# Patient Record
Sex: Female | Born: 1990 | Race: White | Hispanic: No | Marital: Married | Smoking: Never smoker
Health system: Southern US, Community
[De-identification: ages and names within clinical notes are randomized; demographics above are authoritative.]

---

## 2018-04-20 DIAGNOSIS — R110 Nausea: Secondary | ICD-10-CM | POA: Insufficient documentation

## 2018-04-20 DIAGNOSIS — R05 Cough: Secondary | ICD-10-CM | POA: Diagnosis not present

## 2018-04-20 DIAGNOSIS — R42 Dizziness and giddiness: Secondary | ICD-10-CM | POA: Diagnosis not present

## 2018-04-20 DIAGNOSIS — J019 Acute sinusitis, unspecified: Secondary | ICD-10-CM | POA: Insufficient documentation

## 2018-04-20 DIAGNOSIS — R197 Diarrhea, unspecified: Secondary | ICD-10-CM | POA: Insufficient documentation

## 2018-04-20 DIAGNOSIS — F1210 Cannabis abuse, uncomplicated: Secondary | ICD-10-CM | POA: Insufficient documentation

## 2018-04-21 ENCOUNTER — Other Ambulatory Visit: Payer: Self-pay

## 2018-04-21 ENCOUNTER — Encounter: Payer: Self-pay | Admitting: Emergency Medicine

## 2018-04-21 ENCOUNTER — Emergency Department
Admission: EM | Admit: 2018-04-21 | Discharge: 2018-04-21 | Disposition: A | Discharge status: Home / Self Care | Payer: PRIVATE HEALTH INSURANCE | Attending: Emergency Medicine | Admitting: Emergency Medicine

## 2018-04-21 ENCOUNTER — Emergency Department: Payer: PRIVATE HEALTH INSURANCE

## 2018-04-21 DIAGNOSIS — J019 Acute sinusitis, unspecified: Secondary | ICD-10-CM

## 2018-04-21 DIAGNOSIS — R42 Dizziness and giddiness: Secondary | ICD-10-CM

## 2018-04-21 LAB — URINALYSIS, COMPLETE (UACMP) WITH MICROSCOPIC
BILIRUBIN URINE: NEGATIVE
Bacteria, UA: NONE SEEN
GLUCOSE, UA: NEGATIVE mg/dL
HGB URINE DIPSTICK: NEGATIVE
KETONES UR: NEGATIVE mg/dL
LEUKOCYTES UA: NEGATIVE
NITRITE: NEGATIVE
Protein, ur: NEGATIVE mg/dL
Specific Gravity, Urine: 1.027 (ref 1.005–1.030)
pH: 6 (ref 5.0–8.0)

## 2018-04-21 LAB — BASIC METABOLIC PANEL
ANION GAP: 6 (ref 5–15)
BUN: 15 mg/dL (ref 6–20)
CO2: 24 mmol/L (ref 22–32)
Calcium: 9 mg/dL (ref 8.9–10.3)
Chloride: 108 mmol/L (ref 98–111)
Creatinine, Ser: 0.77 mg/dL (ref 0.44–1.00)
GFR calc Af Amer: >60 mL/min (ref >60)
GFR calc non Af Amer: >60 mL/min (ref >60)
GLUCOSE: 112 mg/dL — AB (ref 70–99)
Potassium: 3.4 mmol/L — ABNORMAL LOW (ref 3.5–5.1)
Sodium: 138 mmol/L (ref 135–145)

## 2018-04-21 LAB — HEPATIC FUNCTION PANEL
ALT: 15 U/L (ref 0–44)
AST: 25 U/L (ref 15–41)
Albumin: 4 g/dL (ref 3.5–5.0)
Alkaline Phosphatase: 56 U/L (ref 38–126)
BILIRUBIN INDIRECT: 0.6 mg/dL (ref 0.3–0.9)
BILIRUBIN TOTAL: 0.8 mg/dL (ref 0.3–1.2)
Bilirubin, Direct: 0.2 mg/dL (ref 0.0–0.2)
Total Protein: 7 g/dL (ref 6.5–8.1)

## 2018-04-21 LAB — CBC
HCT: 35.1 % (ref 35.0–47.0)
HEMOGLOBIN: 12 g/dL (ref 12.0–16.0)
MCH: 29.5 pg (ref 26.0–34.0)
MCHC: 34.1 g/dL (ref 32.0–36.0)
MCV: 86.6 fL (ref 80.0–100.0)
Platelets: 317 10*3/uL (ref 150–440)
RBC: 4.05 MIL/uL (ref 3.80–5.20)
RDW: 14.3 % (ref 11.5–14.5)
WBC: 7.9 10*3/uL (ref 3.6–11.0)

## 2018-04-21 LAB — POCT PREGNANCY, URINE: Preg Test, Ur: NEGATIVE

## 2018-04-21 LAB — ETHANOL

## 2018-04-21 LAB — LIPASE, BLOOD: Lipase: 26 U/L (ref 11–51)

## 2018-04-21 MED ORDER — MECLIZINE HCL 25 MG PO TABS: 25.0000 mg | ORAL_TABLET | Freq: Three times a day (TID) | ORAL | 0 refills | Status: AC | PRN

## 2018-04-21 MED ORDER — ONDANSETRON 4 MG PO TBDP
ORAL_TABLET | ORAL | Status: AC
  Administered 2018-04-21: 4 mg via ORAL
  Filled 2018-04-21: qty 1

## 2018-04-21 MED ORDER — ONDANSETRON 4 MG PO TBDP: 4.0000 mg | ORAL_TABLET | Freq: Three times a day (TID) | ORAL | 0 refills | Status: AC | PRN

## 2018-04-21 MED ORDER — ONDANSETRON HCL 4 MG/2ML IJ SOLN
4.0000 mg | Freq: Once | INTRAMUSCULAR | Status: AC
  Administered 2018-04-21: 4 mg via INTRAVENOUS
  Filled 2018-04-21: qty 2

## 2018-04-21 MED ORDER — AZITHROMYCIN 250 MG PO TABS: ORAL_TABLET | ORAL | 0 refills | Status: AC

## 2018-04-21 MED ORDER — SODIUM CHLORIDE 0.9 % IV BOLUS
1000.0000 mL | Freq: Once | INTRAVENOUS | Status: AC
  Administered 2018-04-21: 1000 mL via INTRAVENOUS

## 2018-04-21 MED ORDER — DIAZEPAM 2 MG PO TABS
2.0000 mg | ORAL_TABLET | Freq: Once | ORAL | Status: AC
  Administered 2018-04-21: 2 mg via ORAL
  Filled 2018-04-21: qty 1

## 2018-04-21 MED ORDER — ONDANSETRON 4 MG PO TBDP
4.0000 mg | ORAL_TABLET | Freq: Once | ORAL | Status: AC
  Administered 2018-04-21: 4 mg via ORAL

## 2018-04-21 NOTE — ED Notes (Signed)
Pt reports difficulty focusing and feeling "like I'm still the boat", taking OTC sudafed and Dayquil for "flu like" s/sx

## 2018-04-21 NOTE — ED Notes (Signed)
Patient transported to CT 

## 2018-04-21 NOTE — ED Notes (Signed)
Peripheral IV discontinued. Catheter intact. No signs of infiltration or redness. Gauze applied to IV site.   Discharge instructions reviewed with patient. Questions fielded by this RN. Patient verbalizes understanding of instructions. Patient discharged home in stable condition per sung. No acute distress noted at time of discharge.    

## 2018-04-21 NOTE — ED Notes (Signed)
ED Provider at bedside. 

## 2018-04-21 NOTE — ED Notes (Signed)
Pt reports difficulty focusing on objects worse when standing and pt unsteady, pt reports last sudafed at 2345 yesterday  Pupils appear large

## 2018-04-21 NOTE — Discharge Instructions (Addendum)
1.  You can take medicines as needed for dizziness and nausea (Meclizine/Zofran #20). 2.  Take Z-Pak as prescribed for sinus infection. 3.  Return to the ER for worsening symptoms, persistent vomiting, difficulty breathing or other concerns.

## 2018-04-21 NOTE — ED Provider Notes (Signed)
[facility] Emergency Department Provider Note   ____________________________________________   First MD Initiated Contact with Patient 04/21/18 0210     (approximate)  I have reviewed the triage vital signs and the nursing notes.   HISTORY  Chief Complaint Dizziness    HPI Tracie Woods is a 27 y.o. female who presents to the ED from home with a chief complaint of dizziness.  Patient drove back 6 days ago from a cruise.  That day she had some diarrhea.  Also started feeling like she was having a cold with sinus congestion and dry cough.  Tonight felt dizzy like the room was spinning around her.  Symptoms exacerbated by turning her head.  Symptoms associated with nausea without vomiting.  Denies associated fever, chills, chest pain, shortness of breath, abdominal pain, dysuria.   Past medical history None  There are no active problems to display for this patient.   History reviewed. No pertinent surgical history.  Prior to Admission medications   Medication Sig Start Date End Date Taking? Authorizing Provider  azithromycin (ZITHROMAX Z-PAK) 250 MG tablet Take 2 tablets day #1, then 1 tablet daily until finished 04/21/18   Irean Hong, MD  meclizine (ANTIVERT) 25 MG tablet Take 1 tablet (25 mg total) by mouth 3 (three) times daily as needed for dizziness or nausea. 04/21/18   Irean Hong, MD  ondansetron (ZOFRAN ODT) 4 MG disintegrating tablet Take 1 tablet (4 mg total) by mouth every 8 (eight) hours as needed for nausea or vomiting. 04/21/18   Irean Hong, MD    Allergies Patient has no known allergies.  No family history on file.  Social History Social History   Tobacco Use  . Smoking status: Never Smoker  . Smokeless tobacco: Never Used  Substance Use Topics  . Alcohol use: Yes  . Drug use: Yes    Types: Marijuana    Review of Systems  Constitutional: No fever/chills Eyes: No visual changes. ENT: No sore throat.  Positive for  nasal congestion. Cardiovascular: Denies chest pain. Respiratory: Denies shortness of breath. Gastrointestinal: No abdominal pain.  Positive for nausea, no vomiting.  No diarrhea.  No constipation. Genitourinary: Negative for dysuria. Musculoskeletal: Negative for back pain. Skin: Negative for rash. Neurological: Positive for dizziness.  Negative for headaches, focal weakness or numbness.   ____________________________________________   PHYSICAL EXAM:  VITAL SIGNS: ED Triage Vitals  Enc Vitals Group     BP 04/21/18 0006 (!) 126/54     Pulse Rate 04/21/18 0006 65     Resp 04/21/18 0006 18     Temp 04/21/18 0006 97.8 F (36.6 C)     Temp Source 04/21/18 0006 Oral     SpO2 04/21/18 0006 100 %     Weight 04/21/18 0006 230 lb (104.3 kg)     Height 04/21/18 0006 5\' 3"  (1.6 m)     Head Circumference --      Peak Flow --      Pain Score 04/21/18 0013 0     Pain Loc --      Pain Edu? --      Excl. in GC? --     Constitutional: Alert and oriented. Well appearing and in mild acute distress. Eyes: Conjunctivae are normal. PERRL. EOMI. Head: Atraumatic.  No sinus tenderness to palpation. Ears: Left TM with fluid. Nose: Congestion/rhinnorhea. Mouth/Throat: Mucous membranes are moist.  Oropharynx non-erythematous. Neck: No stridor.  Supple neck without meningismus.  No carotid bruits. Hematological/Lymphatic/Immunilogical:  No cervical lymphadenopathy. Cardiovascular: Normal rate, regular rhythm. Grossly normal heart sounds.  Good peripheral circulation. Respiratory: Normal respiratory effort.  No retractions. Lungs CTAB. Gastrointestinal: Soft and nontender. No distention. No abdominal bruits. No CVA tenderness. Musculoskeletal: No lower extremity tenderness nor edema.  No joint effusions. Neurologic:  Normal speech and language. No gross focal neurologic deficits are appreciated.  Skin:  Skin is warm, dry and intact. No rash noted. Psychiatric: Mood and affect are normal. Speech  and behavior are normal.  ____________________________________________   LABS (all labs ordered are listed, but only abnormal results are displayed)  Labs Reviewed  BASIC METABOLIC PANEL - Abnormal; Notable for the following components:      Result Value   Potassium 3.4 (*)    Glucose, Bld 112 (*)    All other components within normal limits  URINALYSIS, COMPLETE (UACMP) WITH MICROSCOPIC - Abnormal; Notable for the following components:   Color, Urine YELLOW (*)    APPearance CLEAR (*)    All other components within normal limits  CBC  HEPATIC FUNCTION PANEL  LIPASE, BLOOD  ETHANOL  POCT PREGNANCY, URINE  CBG MONITORING, ED  POC URINE PREG, ED   ____________________________________________  EKG  ED ECG REPORT I, SUNG,JADE J, the attending physician, personally viewed and interpreted this ECG.   Date: 04/21/2018  EKG Time: 0027  Rate: 62  Rhythm: normal EKG, normal sinus rhythm  Axis: Normal  Intervals:none  ST&T Change: Nonspecific  ____________________________________________  RADIOLOGY  ED MD interpretation: No acute ICH  Official radiology report(s): Ct Head Wo Contrast  Result Date: 04/21/2018 CLINICAL DATA:  27 year old female with dizziness. EXAM: CT HEAD WITHOUT CONTRAST TECHNIQUE: Contiguous axial images were obtained from the base of the skull through the vertex without intravenous contrast. COMPARISON:  None. FINDINGS: Brain: No evidence of acute infarction, hemorrhage, hydrocephalus, extra-axial collection or mass lesion/mass effect. Vascular: No hyperdense vessel or unexpected calcification. Skull: Normal. Negative for fracture or focal lesion. Sinuses/Orbits: No acute finding. Other: None. IMPRESSION: Normal noncontrast CT of the brain. Electronically Signed   By: Elgie Collard M.D.   On: 04/21/2018 03:18    ____________________________________________   PROCEDURES  Procedure(s) performed: None  Procedures  Critical Care performed:  No  ____________________________________________   INITIAL IMPRESSION / ASSESSMENT AND PLAN / ED COURSE  As part of my medical decision making, I reviewed the following data within the electronic MEDICAL RECORD NUMBER History obtained from family, Nursing notes reviewed and incorporated, Labs reviewed, EKG interpreted, Old chart reviewed, Radiograph reviewed and Notes from prior ED visits   27 year old female who presents with a one-week history of URI type symptoms, now with dizziness.  Differential diagnosis includes but is not limited to eustachian tube dysfunction, vertigo, CAD, TIA, electrolyte abnormalities, etc.  Laboratory results grossly unremarkable.  Clinically patient's symptoms suggest vertigo.  Will obtain CT head to evaluate for intracranial abnormalities.  Will initiate IV fluid resuscitation, 4 mg IV Zofran for nausea, 2 mg oral Valium for dizziness.  Clinical Course as of Apr 21 658  Sat Apr 21, 2018  0543 Patient feeling significantly better.  Will discharge home with prescriptions for meclizine and Zofran to use as needed.  Strict return precautions given.  Patient and wife verbalize understanding and agree with plan of care.   [JS]  [id number] Patient requesting antibiotic to clear up her sinus infection.  Will prescribe azithromycin.   [JS]    Clinical Course User Index [JS] Irean Hong, MD     ____________________________________________  FINAL CLINICAL IMPRESSION(S) / ED DIAGNOSES  Final diagnoses:  Dizziness  Vertigo  Acute non-recurrent sinusitis, unspecified location     ED Discharge Orders         Ordered    meclizine (ANTIVERT) 25 MG tablet  3 times daily PRN     04/21/18 0544    ondansetron (ZOFRAN ODT) 4 MG disintegrating tablet  Every 8 hours PRN     04/21/18 0544    azithromycin (ZITHROMAX Z-PAK) 250 MG tablet     04/21/18 1610           Note:  This document was prepared using Dragon voice recognition software and may include unintentional  dictation errors.    Irean Hong, MD 04/21/18 [phone]

## 2018-04-21 NOTE — ED Triage Notes (Signed)
Pt arrives POV to triage with c/o dizziness and "feeling drunk". Pt reports that she got back Sunday from a cruise and had an URI this weak. Pt is in NAD.

## 2018-04-23 LAB — GLUCOSE, CAPILLARY: Glucose-Capillary: 103 mg/dL — ABNORMAL HIGH (ref 70–99)

## 2019-10-13 ENCOUNTER — Other Ambulatory Visit: Payer: Self-pay

## 2019-10-13 ENCOUNTER — Emergency Department: Payer: No Typology Code available for payment source

## 2019-10-13 DIAGNOSIS — R1011 Right upper quadrant pain: Secondary | ICD-10-CM | POA: Insufficient documentation

## 2019-10-13 DIAGNOSIS — R1013 Epigastric pain: Secondary | ICD-10-CM | POA: Insufficient documentation

## 2019-10-13 LAB — COMPREHENSIVE METABOLIC PANEL
ALT: 16 U/L (ref 0–44)
AST: 73 U/L — ABNORMAL HIGH (ref 15–41)
Albumin: 4.3 g/dL (ref 3.5–5.0)
Alkaline Phosphatase: 51 U/L (ref 38–126)
Anion gap: 9 (ref 5–15)
BUN: 13 mg/dL (ref 6–20)
CO2: 21 mmol/L — ABNORMAL LOW (ref 22–32)
Calcium: 9.6 mg/dL (ref 8.9–10.3)
Chloride: 107 mmol/L (ref 98–111)
Creatinine, Ser: 0.7 mg/dL (ref 0.44–1.00)
GFR calc Af Amer: >60 mL/min (ref >60)
GFR calc non Af Amer: >60 mL/min (ref >60)
Glucose, Bld: 102 mg/dL — ABNORMAL HIGH (ref 70–99)
Potassium: 3.4 mmol/L — ABNORMAL LOW (ref 3.5–5.1)
Sodium: 137 mmol/L (ref 135–145)
Total Bilirubin: 0.6 mg/dL (ref 0.3–1.2)
Total Protein: 7.4 g/dL (ref 6.5–8.1)

## 2019-10-13 LAB — TROPONIN I (HIGH SENSITIVITY): Troponin I (High Sensitivity): <2 ng/L (ref <18)

## 2019-10-13 LAB — CBC
HCT: 39 % (ref 36.0–46.0)
Hemoglobin: 13.1 g/dL (ref 12.0–15.0)
MCH: 29.7 pg (ref 26.0–34.0)
MCHC: 33.6 g/dL (ref 30.0–36.0)
MCV: 88.4 fL (ref 80.0–100.0)
Platelets: 300 10*3/uL (ref 150–400)
RBC: 4.41 MIL/uL (ref 3.87–5.11)
RDW: 13.7 % (ref 11.5–15.5)
WBC: 7.8 10*3/uL (ref 4.0–10.5)
nRBC: 0 % (ref 0.0–0.2)

## 2019-10-13 NOTE — ED Triage Notes (Signed)
Pt states a week of central chest pain that radiates to left shoulder. Pt states she feels shob, states nausea, denies dizziness. Pt states after eating "I feel terrible". Pt denies fever.

## 2019-10-14 ENCOUNTER — Emergency Department: Payer: No Typology Code available for payment source

## 2019-10-14 ENCOUNTER — Encounter: Payer: Self-pay | Admitting: Radiology

## 2019-10-14 ENCOUNTER — Emergency Department
Admission: EM | Admit: 2019-10-14 | Discharge: 2019-10-14 | Disposition: A | Discharge status: Home / Self Care | Payer: No Typology Code available for payment source | Attending: Emergency Medicine | Admitting: Emergency Medicine

## 2019-10-14 DIAGNOSIS — R1011 Right upper quadrant pain: Secondary | ICD-10-CM

## 2019-10-14 DIAGNOSIS — R1013 Epigastric pain: Secondary | ICD-10-CM

## 2019-10-14 LAB — FIBRIN DERIVATIVES D-DIMER (ARMC ONLY): Fibrin derivatives D-dimer (ARMC): 211.53 ng/mL (FEU) (ref 0.00–499.00)

## 2019-10-14 MED ORDER — LIDOCAINE VISCOUS HCL 2 % MT SOLN
15.0000 mL | Freq: Once | OROMUCOSAL | Status: AC
  Administered 2019-10-14: 15 mL via ORAL
  Filled 2019-10-14: qty 15

## 2019-10-14 MED ORDER — PANTOPRAZOLE SODIUM 40 MG PO TBEC: 40.0000 mg | DELAYED_RELEASE_TABLET | Freq: Two times a day (BID) | ORAL | 1 refills | Status: AC

## 2019-10-14 MED ORDER — ONDANSETRON 4 MG PO TBDP
4.0000 mg | ORAL_TABLET | Freq: Once | ORAL | Status: AC
  Administered 2019-10-14: 4 mg via ORAL

## 2019-10-14 MED ORDER — ALUM & MAG HYDROXIDE-SIMETH 200-200-20 MG/5ML PO SUSP
30.0000 mL | Freq: Once | ORAL | Status: AC
  Administered 2019-10-14: 30 mL via ORAL
  Filled 2019-10-14: qty 30

## 2019-10-14 MED ORDER — IOHEXOL 300 MG/ML  SOLN
100.0000 mL | Freq: Once | INTRAMUSCULAR | Status: AC | PRN
  Administered 2019-10-14: 100 mL via INTRAVENOUS

## 2019-10-14 MED ORDER — ONDANSETRON 4 MG PO TBDP
ORAL_TABLET | ORAL | Status: AC
  Filled 2019-10-14: qty 1

## 2019-10-14 NOTE — ED Notes (Signed)
Ultrasound tech at bedside for portable ultrasound

## 2019-10-14 NOTE — ED Notes (Signed)
Patient c/o chest pain radiating to back and epigastric region.

## 2019-10-14 NOTE — ED Notes (Signed)
Patient reports nausea has gotten better. Patient able to ambulate around room with no issue/no additional discomfort. MD informed. MD ordered dishcharge.

## 2019-10-14 NOTE — ED Notes (Signed)
Patient transported to CT 

## 2019-10-14 NOTE — ED Notes (Signed)
Reviewed discharge instructions, follow-up care, and prescriptions with patient. Patient verbalized understanding of all information reviewed. Patient stable, with no distress noted at this time.    

## 2019-10-14 NOTE — ED Provider Notes (Signed)
[facility] Emergency Department Provider Note  ____________________________________________   First MD Initiated Contact with Patient 10/14/19 0131     (approximate)  I have reviewed the triage vital signs and the nursing notes.   HISTORY  Chief Complaint Chest Pain    HPI Tracie Woods is a 29 y.o. female with below list of previous medical conditions presents to the emergency department secondary to 6 out of 10 epigastric abdominal pain with radiation to the left shoulder and associated nausea and dyspnea.  Patient states that pain is worse after eating.  Patient states this has been occurring since yesterday.        History reviewed. No pertinent past medical history.  There are no problems to display for this patient.   No past surgical history on file.  Prior to Admission medications   Medication Sig Start Date End Date Taking? Authorizing Provider  azithromycin (ZITHROMAX Z-PAK) 250 MG tablet Take 2 tablets day #1, then 1 tablet daily until finished 04/21/18   Irean Hong, MD  meclizine (ANTIVERT) 25 MG tablet Take 1 tablet (25 mg total) by mouth 3 (three) times daily as needed for dizziness or nausea. 04/21/18   Irean Hong, MD  ondansetron (ZOFRAN ODT) 4 MG disintegrating tablet Take 1 tablet (4 mg total) by mouth every 8 (eight) hours as needed for nausea or vomiting. 04/21/18   Irean Hong, MD    Allergies Patient has no known allergies.  No family history on file.  Social History Social History   Tobacco Use  . Smoking status: Never Smoker  . Smokeless tobacco: Never Used  Substance Use Topics  . Alcohol use: Yes  . Drug use: Yes    Types: Marijuana    Review of Systems Constitutional: No fever/chills Eyes: No visual changes. ENT: No sore throat. Cardiovascular: Denies chest pain. Respiratory: Denies shortness of breath. Gastrointestinal: Positive for abdominal pain   And nausea no vomiting.  No diarrhea.  No  constipation. Genitourinary: Negative for dysuria. Musculoskeletal: Negative for neck pain.  Negative for back pain. Integumentary: Negative for rash. Neurological: Negative for headaches, focal weakness or numbness.   ____________________________________________   PHYSICAL EXAM:  VITAL SIGNS: ED Triage Vitals  Enc Vitals Group     BP 10/13/19 2206 129/84     Pulse Rate 10/13/19 2206 76     Resp 10/13/19 2206 20     Temp 10/13/19 2206 97.8 F (36.6 C)     Temp Source 10/13/19 2206 Oral     SpO2 10/13/19 2206 100 %     Weight 10/13/19 2204 104.3 kg (230 lb)     Height 10/13/19 2204 1.6 m (5\' 3" )     Head Circumference --      Peak Flow --      Pain Score 10/13/19 2204 6     Pain Loc --      Pain Edu? --      Excl. in GC? --     Constitutional: Alert and oriented.  Eyes: Conjunctivae are normal.  Mouth/Throat: Patient is wearing a mask. Neck: No stridor.  No meningeal signs.   Cardiovascular: Normal rate, regular rhythm. Good peripheral circulation. Grossly normal heart sounds. Respiratory: Normal respiratory effort.  No retractions. Gastrointestinal: Epigastric tenderness to palpation.. No distention.  Musculoskeletal: No lower extremity tenderness nor edema. No gross deformities of extremities. Neurologic:  Normal speech and language. No gross focal neurologic deficits are appreciated.  Skin:  Skin is warm, dry and  intact. Psychiatric: Mood and affect are normal. Speech and behavior are normal.  ____________________________________________   LABS (all labs ordered are listed, but only abnormal results are displayed)  Labs Reviewed  COMPREHENSIVE METABOLIC PANEL - Abnormal; Notable for the following components:      Result Value   Potassium 3.4 (*)    CO2 21 (*)    Glucose, Bld 102 (*)    AST 73 (*)    All other components within normal limits  CBC  FIBRIN DERIVATIVES D-DIMER ([facility] ONLY)  POC URINE PREG, ED  TROPONIN I (HIGH SENSITIVITY)    ____________________________________________  EKG  ED ECG REPORT I, [facility] N Shelisha Gautier, the attending physician, personally viewed and interpreted this ECG.   Date: 10/13/2019  EKG Time: 10:04 PM  Rate: 89  Rhythm: Normal sinus rhythm  Axis: Normal  Intervals: Normal  ST&T Change: None I think stimulated probe  ____________________________________________  RADIOLOGY I, Gwinner N Yaritzi Craun, personally viewed and evaluated these images (plain radiographs) as part of my medical decision making, as well as reviewing the written report by the radiologist.  ED MD interpretation: No active cardiopulmonary disease noted on chest x-ray   CT abdomen pelvis revealed no acute intra-abdominal pathology  Upper quadrant ultrasound normal  Official radiology report(s): DG Chest 2 View  Result Date: 10/13/2019 CLINICAL DATA:  Chest pain EXAM: CHEST - 2 VIEW COMPARISON:  July 06, 2017 FINDINGS: The heart size and mediastinal contours are within normal limits. Both lungs are clear. The visualized skeletal structures are unremarkable. IMPRESSION: No active cardiopulmonary disease. Electronically Signed   By: Prudencio Pair M.D.   On: 10/13/2019 22:33   CT ABDOMEN PELVIS W CONTRAST  Result Date: 10/14/2019 CLINICAL DATA:  Chest pain radiating into the back and epigastric region EXAM: CT ABDOMEN AND PELVIS WITH CONTRAST TECHNIQUE: Multidetector CT imaging of the abdomen and pelvis was performed using the standard protocol following bolus administration of intravenous contrast. CONTRAST:  121mL OMNIPAQUE IOHEXOL 300 MG/ML  SOLN COMPARISON:  None. FINDINGS: Lower chest: The visualized heart size within normal limits. No pericardial fluid/thickening. No hiatal hernia. The visualized portions of the lungs are clear. Hepatobiliary: The liver is normal in density without focal abnormality.The main portal vein is patent. No evidence of calcified gallstones, gallbladder wall thickening or biliary dilatation.  Pancreas: Unremarkable. No pancreatic ductal dilatation or surrounding inflammatory changes. Spleen: Normal in size without focal abnormality. Adrenals/Urinary Tract: Both adrenal glands appear normal. The kidneys and collecting system appear normal without evidence of urinary tract calculus or hydronephrosis. Bladder is unremarkable. Stomach/Bowel: The stomach, small bowel, and colon are normal in appearance. No inflammatory changes, wall thickening, or obstructive findings.The appendix is normal. Vascular/Lymphatic: There are no enlarged mesenteric, retroperitoneal, or pelvic lymph nodes. No significant vascular findings are present. Reproductive: Small amount of fluid in the endometrial canal. There is a 2 cm low-density lesion in the left ovary, likely dominant follicle/ovarian cyst. Other: Fat containing anterior abdominal wall umbilical hernia seen. Musculoskeletal: No acute or significant osseous findings. IMPRESSION: No acute intra-abdominal or pelvic pathology to explain the patient's symptoms. Electronically Signed   By: Prudencio Pair M.D.   On: 10/14/2019 03:28   US ABDOMEN LIMITED RUQ  Result Date: 10/14/2019 CLINICAL DATA:  Chest pain for 1 week EXAM: ULTRASOUND ABDOMEN LIMITED RIGHT UPPER QUADRANT COMPARISON:  None. FINDINGS: Gallbladder: No gallstones or wall thickening visualized. No sonographic Murphy sign noted by sonographer. Common bile duct: Diameter: 2.6 mm Liver: No focal lesion identified. Within normal limits in  parenchymal echogenicity. Portal vein is patent on color Doppler imaging with normal direction of blood flow towards the liver. Other: None. IMPRESSION: Normal examination Electronically Signed   By: Jonna Clark M.D.   On: 10/14/2019 02:31      Procedures   ____________________________________________   INITIAL IMPRESSION / MDM / ASSESSMENT AND PLAN / ED COURSE  As part of my medical decision making, I reviewed the following data within the electronic MEDICAL RECORD NUMBER    29 year old female presented with above-stated history and physical exam concerning for cholelithiasis versus other potential gallbladder disease, peptic ulcer gastritis and less likely diverticulitis.  Given associated dyspnea did consider pulmonary emboli and a such D-dimer was performed which was normal.  Ultrasound revealed no evidence of gallbladder disease CT scan abdomen pelvis also unremarkable.  Patient be prescribed Protonix for home with recommendation to follow-up with Dr. Norma Fredrickson gastroenterology  ____________________________________________  FINAL CLINICAL IMPRESSION(S) / ED DIAGNOSES  Final diagnoses:  RUQ pain     MEDICATIONS GIVEN DURING THIS VISIT:  Medications  alum & mag hydroxide-simeth (MAALOX/MYLANTA) 200-200-20 MG/5ML suspension 30 mL (30 mLs Oral Given 10/14/19 0150)    And  lidocaine (XYLOCAINE) 2 % viscous mouth solution 15 mL (15 mLs Oral Given 10/14/19 0150)  iohexol (OMNIPAQUE) 300 MG/ML solution 100 mL (100 mLs Intravenous Contrast Given 10/14/19 6979)     ED Discharge Orders    None      *Please note:  Deanie Jupiter was evaluated in Emergency Department on 10/14/2019 for the symptoms described in the history of present illness. She was evaluated in the context of the global COVID-19 pandemic, which necessitated consideration that the patient might be at risk for infection with the SARS-CoV-2 virus that causes COVID-19. Institutional protocols and algorithms that pertain to the evaluation of patients at risk for COVID-19 are in a state of rapid change based on information released by regulatory bodies including the CDC and federal and state organizations. These policies and algorithms were followed during the patient's care in the ED.  Some ED evaluations and interventions may be delayed as a result of limited staffing during the pandemic.*  Note:  This document was prepared using Dragon voice recognition software and may include unintentional dictation  errors.   Darci Current, MD 10/14/19 [phone]

## 2019-10-14 NOTE — ED Notes (Signed)
Patient had large emesis. MD to bedside.

## 2020-09-05 IMAGING — CT CT ABD-PELV W/ CM
2 of 4 series · 16 of 46 positions shown, 18 images · IV contrast (APPLIED)
Comparison: None.

CLINICAL DATA: Chest pain radiating into the back and epigastric
region

EXAM:
CT ABDOMEN AND PELVIS WITH CONTRAST
TECHNIQUE: Multidetector CT imaging of the abdomen and pelvis was performed
using the standard protocol following bolus administration of
intravenous contrast.
CONTRAST:  100mL OMNIPAQUE IOHEXOL 300 MG/ML  SOLN

[Series 2: routine abd/pel with · axial · 0.85mm/px · z∈[-1046,-621]mm · 13 of 93 slices shown, 15 images]
[im 4/93  soft-tissue]
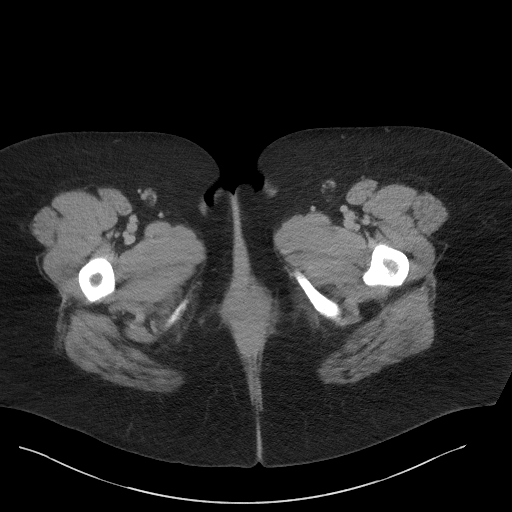
[im 4/93  bone]
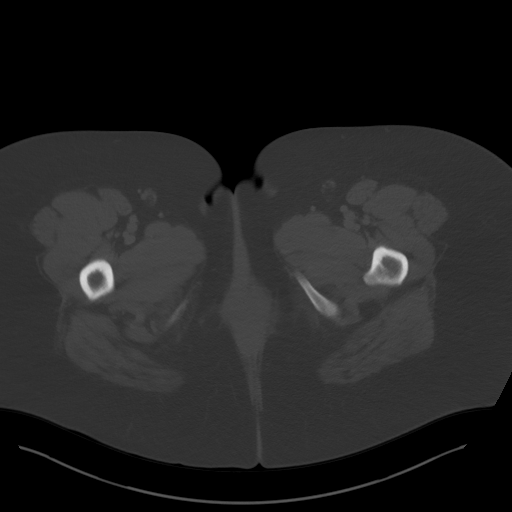
[im 12/93  soft-tissue]
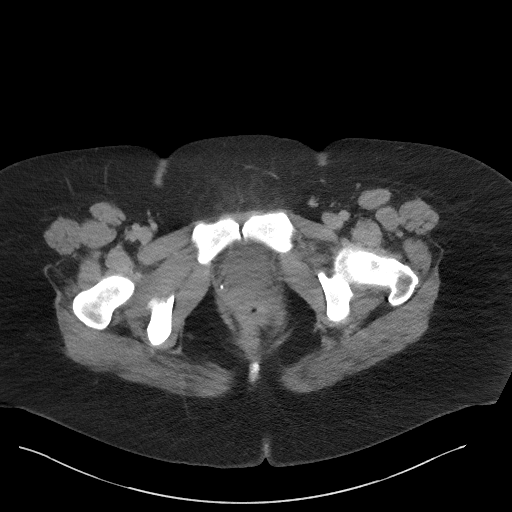
[im 20/93  soft-tissue]
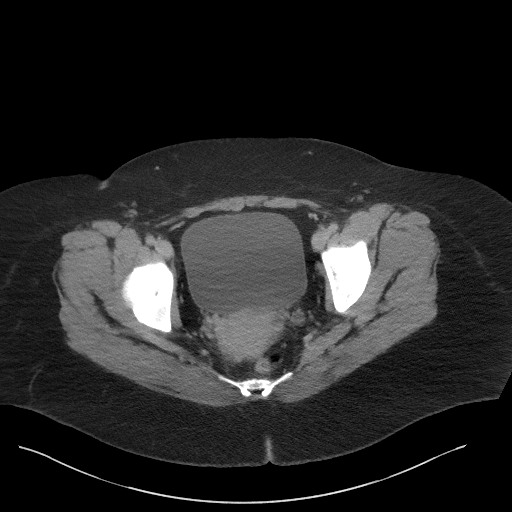
[im 27/93  soft-tissue]
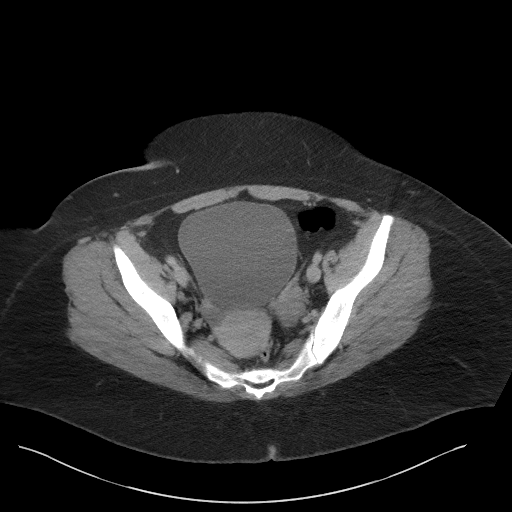
[im 31/93  soft-tissue]
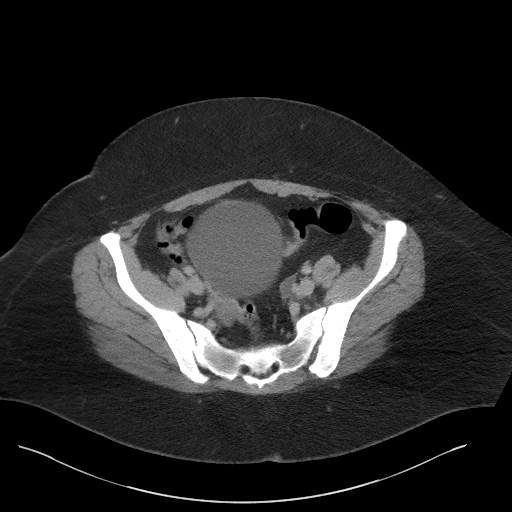
[im 39/93  soft-tissue]
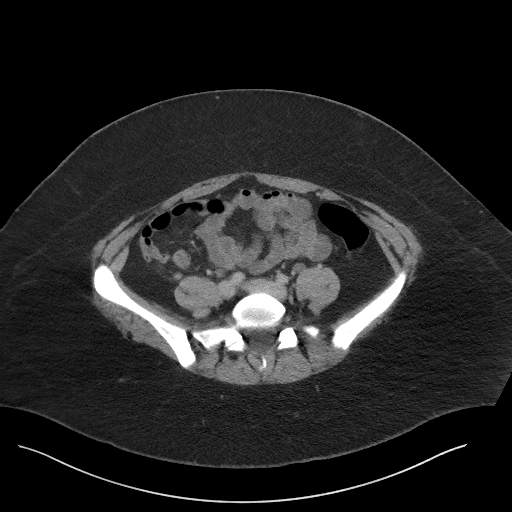
[im 47/93  soft-tissue]
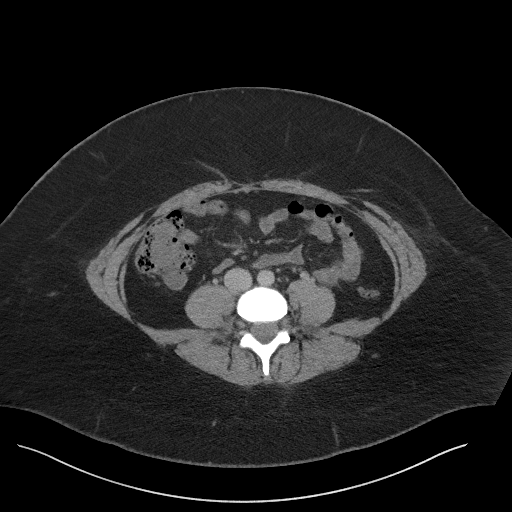
[im 54/93  soft-tissue]
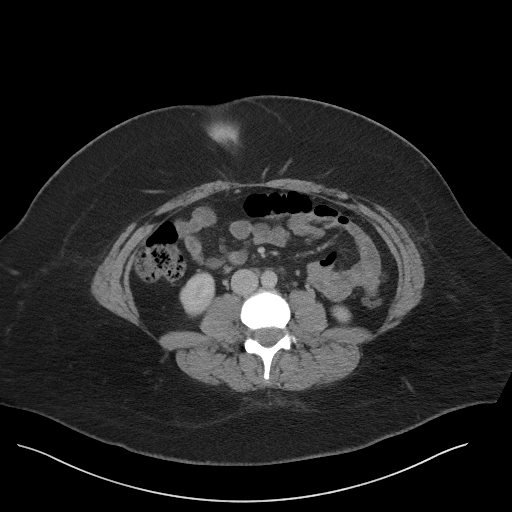
[im 62/93  soft-tissue]
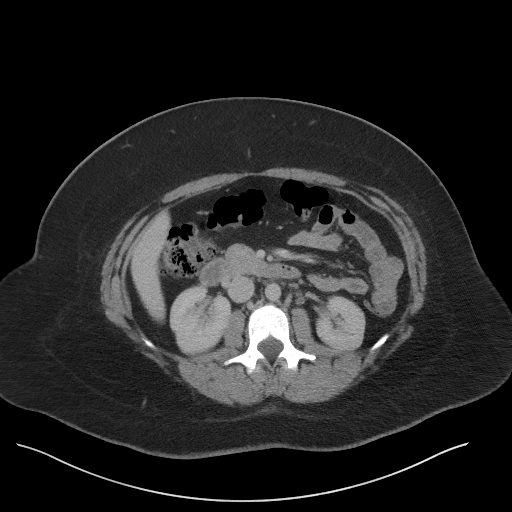
[im 62/93  bone]
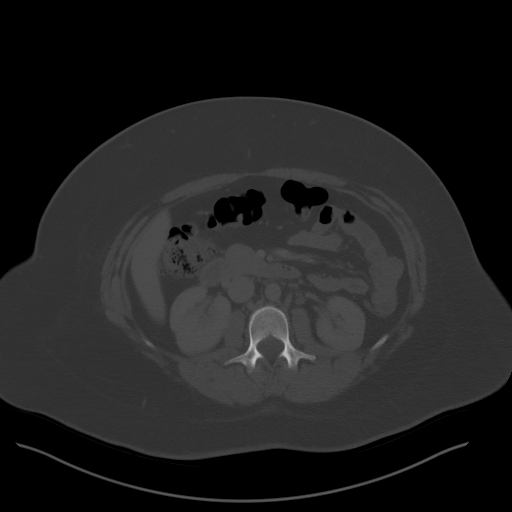
[im 66/93  soft-tissue]
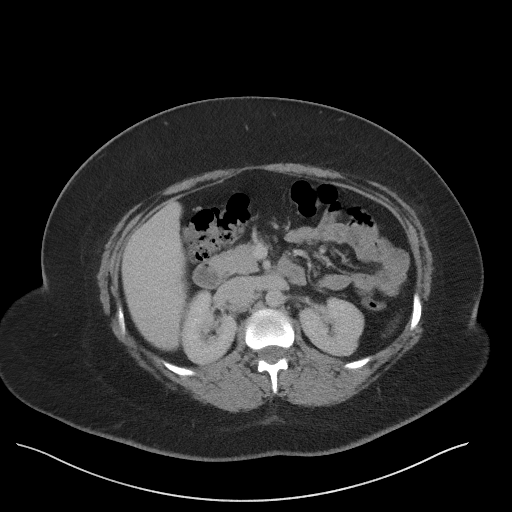
[im 73/93  soft-tissue]
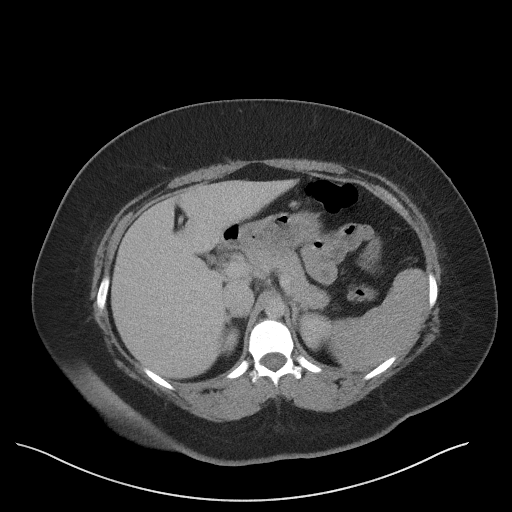
[im 81/93  soft-tissue]
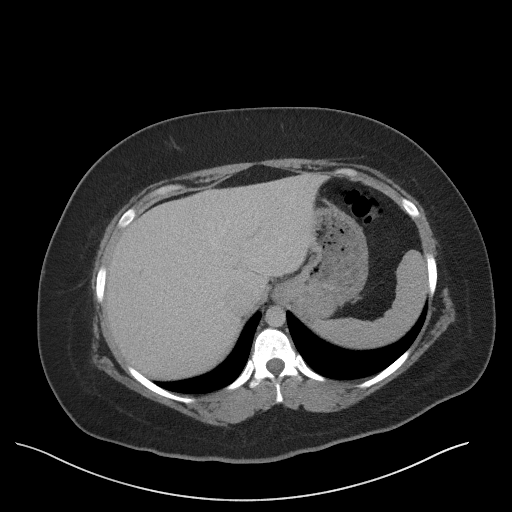
[im 89/93  soft-tissue]
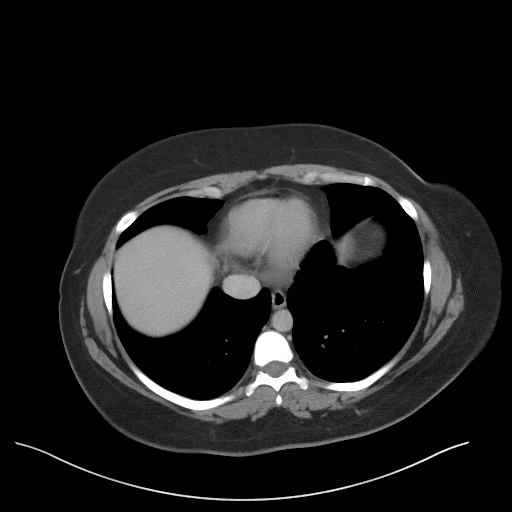

[Series 5: coronal st · coronal · 0.87mm/px · 3 of 102 slices shown]
[im 34/102  soft-tissue]
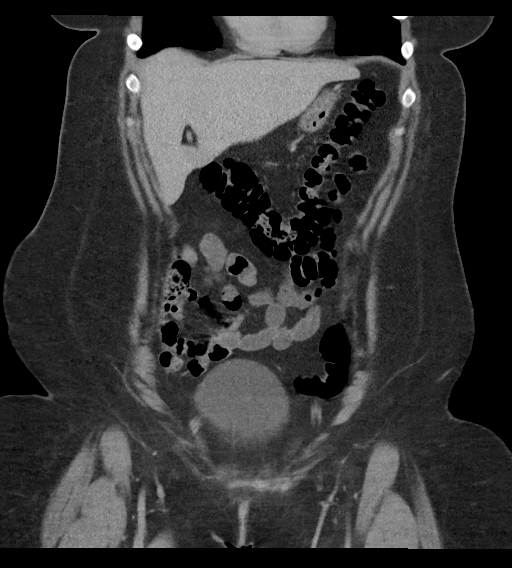
[im 45/102  soft-tissue]
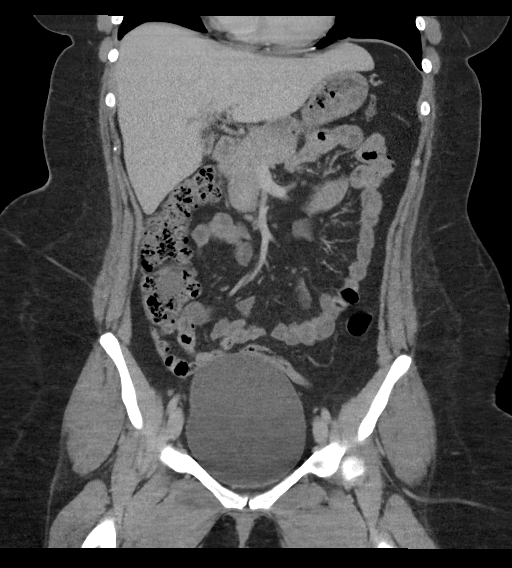
[im 57/102  soft-tissue]
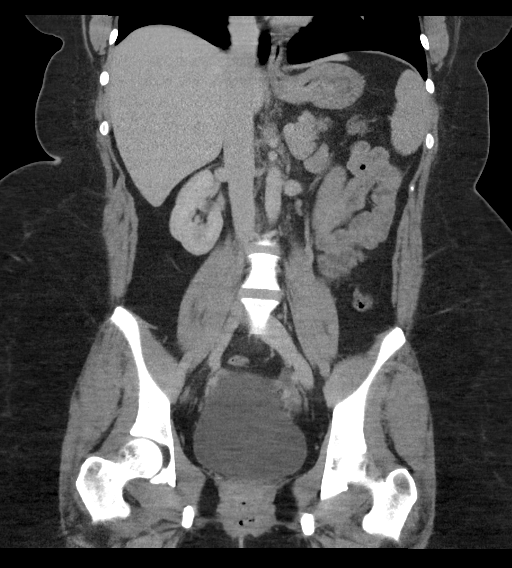

[16 of 46 positions shown; findings below may reference images not displayed]

FINDINGS: Lower chest: The visualized heart size within normal limits. No
pericardial fluid/thickening.

No hiatal hernia.

The visualized portions of the lungs are clear.

Hepatobiliary: The liver is normal in density without focal
abnormality.The main portal vein is patent. No evidence of calcified
gallstones, gallbladder wall thickening or biliary dilatation.

Pancreas: Unremarkable. No pancreatic ductal dilatation or
surrounding inflammatory changes.

Spleen: Normal in size without focal abnormality.

Adrenals/Urinary Tract: Both adrenal glands appear normal. The
kidneys and collecting system appear normal without evidence of
urinary tract calculus or hydronephrosis. Bladder is unremarkable.

Stomach/Bowel: The stomach, small bowel, and colon are normal in
appearance. No inflammatory changes, wall thickening, or obstructive
findings.The appendix is normal.

Vascular/Lymphatic: There are no enlarged mesenteric,
retroperitoneal, or pelvic lymph nodes. No significant vascular
findings are present.

Reproductive: Small amount of fluid in the endometrial canal. There
is a 2 cm low-density lesion in the left ovary, likely dominant
follicle/ovarian cyst.

Other: Fat containing anterior abdominal wall umbilical hernia seen.

Musculoskeletal: No acute or significant osseous findings.
IMPRESSION: No acute intra-abdominal or pelvic pathology to explain the
patient's symptoms.
